# Patient Record
Sex: Male | Born: 1950 | Race: Black or African American | Hispanic: No | Marital: Single | Smoking: Current every day smoker
Health system: Southern US, Community
[De-identification: ages and names within clinical notes are randomized; demographics above are authoritative.]

## PROBLEM LIST (undated history)

## (undated) DIAGNOSIS — I10 Essential (primary) hypertension: Secondary | ICD-10-CM

---

## 2008-04-27 ENCOUNTER — Emergency Department: Payer: Self-pay | Admitting: Emergency Medicine

## 2015-10-27 ENCOUNTER — Encounter: Payer: Self-pay | Admitting: *Deleted

## 2015-10-27 ENCOUNTER — Ambulatory Visit
Admission: EM | Admit: 2015-10-27 | Discharge: 2015-10-27 | Disposition: A | Discharge status: Home / Self Care | Payer: Self-pay | Attending: Internal Medicine | Admitting: Internal Medicine

## 2015-10-27 DIAGNOSIS — Z024 Encounter for examination for driving license: Secondary | ICD-10-CM

## 2015-10-27 DIAGNOSIS — Z0289 Encounter for other administrative examinations: Secondary | ICD-10-CM

## 2015-10-27 HISTORY — DX: Essential (primary) hypertension: I10

## 2015-10-27 LAB — DEPT OF TRANSP DIPSTICK, URINE (ARMC ONLY)
GLUCOSE, UA: NEGATIVE mg/dL
Hgb urine dipstick: NEGATIVE
PROTEIN: NEGATIVE mg/dL
Specific Gravity, Urine: 1.02 (ref 1.005–1.030)

## 2015-10-27 NOTE — ED Provider Notes (Signed)
  [facility]    CSN: [id number] Arrival date & time: 10/27/15  1345  First Provider Contact:  None       History   Chief Complaint Chief Complaint  Patient presents with  . Commercial Driver's License Exam    HPI Shane Powers is a 65 y.o. male. Hx hypertension[facility] in the process of moving to this area and will change PCPs.    HPI  Past Medical History:  Diagnosis Date  . Hypertension     History reviewed. No pertinent surgical history.     Home Medications    Prior to Admission medications   Medication Sig Start Date End Date Taking? Authorizing Provider  lisinopril (PRINIVIL,ZESTRIL) 5 MG tablet Take 5 mg by mouth daily.   Yes Historical Provider, MD    Family History History reviewed. No pertinent family history.  Social History Social History  Substance Use Topics  . Smoking status: Current Every Day Smoker  . Smokeless tobacco: Never Used  . Alcohol use Yes     Allergies   Review of patient's allergies indicates no known allergies.   Review of Systems Review of Systems   Physical Exam Triage Vital Signs ED Triage Vitals [10/27/15 1458]  Enc Vitals Group     BP (!) 157/95     Pulse Rate 74     Resp 16     Temp 97 F (36.1 C)     Temp Source Tympanic     SpO2 97 %     Updated Vital Signs BP (!) 142/87 (BP Location: Left Arm)   Pulse 72   Temp 97 F (36.1 C) (Tympanic)   Resp 16   SpO2 97%  Physical Exam  Constitutional: He is oriented to person, place, and time. No distress.  Alert, nicely groomed  HENT:  Head: Atraumatic.  Eyes:  Conjugate gaze, no eye redness/drainage  Neck: Neck supple.  Cardiovascular: Normal rate and regular rhythm.   Pulmonary/Chest: No respiratory distress.  Lungs clear, symmetric breath sounds  Abdominal: Soft. He exhibits no distension. There is no tenderness. There is no guarding.  Musculoskeletal: Normal range of motion.  Neurological: He is alert and  oriented to person, place, and time.  Skin: Skin is warm and dry.  No cyanosis  Nursing note and vitals reviewed.    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  Wear corrective lenses.       Eustace MooreLaura W Fares Ramthun, MD 10/28/15 2220

## 2015-10-27 NOTE — ED Triage Notes (Signed)
Here for DOT physical. 

## 2016-06-07 ENCOUNTER — Ambulatory Visit
Admission: EM | Admit: 2016-06-07 | Discharge: 2016-06-07 | Disposition: A | Discharge status: Home / Self Care | Payer: Medicare Other | Attending: Emergency Medicine | Admitting: Emergency Medicine

## 2016-06-07 ENCOUNTER — Encounter: Payer: Self-pay | Admitting: Emergency Medicine

## 2016-06-07 ENCOUNTER — Ambulatory Visit: Payer: Medicare Other

## 2016-06-07 DIAGNOSIS — R05 Cough: Secondary | ICD-10-CM | POA: Diagnosis present

## 2016-06-07 DIAGNOSIS — R0981 Nasal congestion: Secondary | ICD-10-CM

## 2016-06-07 DIAGNOSIS — J069 Acute upper respiratory infection, unspecified: Secondary | ICD-10-CM | POA: Diagnosis not present

## 2016-06-07 MED ORDER — DOXYCYCLINE HYCLATE 100 MG PO CAPS: 100.0000 mg | ORAL_CAPSULE | Freq: Two times a day (BID) | ORAL | 0 refills | Status: DC

## 2016-06-07 MED ORDER — BENZONATATE 200 MG PO CAPS: 200.0000 mg | ORAL_CAPSULE | Freq: Three times a day (TID) | ORAL | 0 refills | Status: DC | PRN

## 2016-06-07 NOTE — ED Provider Notes (Signed)
[facility] ____________________________________________  Time seen: Approximately 10:17 AM  I have reviewed the triage vital signs and the nursing notes.   HISTORY  Chief Complaint Cough  HPI Shane Powers is a 66 y.o. male presenting for the complaints of 1 week of runny nose, nasal congestion, cough, chest congestion. Reports intermittent chills and body aches, but denies fevers. States occasional scratchy throat last week, but only with cough, denies current sore throat. Reports cough does intermittently come up at night. States cough is primarily dry hacking cough. Denies wheezing. Reports has been taking multiple over-the-counter cough and congestion agents with minimal improvement. 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  Denies chest pain, shortness of breath, abdominal pain, dysuria, extremity pain, extremity swelling or rash. Denies recent sickness. Denies recent antibiotic use.  Denies cardiac history. Denies renal insufficiency. Denies history of emphysema, COPD, PE/DVT, or asthma. Reports intermittent smoker.   Past Medical History:  Diagnosis Date  . Hypertension     There are no active problems to display for this patient.   History reviewed. No pertinent surgical history.   No current facility-administered medications for this encounter.   Current Outpatient Prescriptions:  .  amLODipine (NORVASC) 10 MG tablet, Take 10 mg by mouth daily., Disp: , Rfl:  .  benzonatate (TESSALON) 200 MG capsule, Take 1 capsule (200 mg total) by mouth 3 (three) times daily as needed for cough., Disp: 20 capsule, Rfl: 0 .  doxycycline (VIBRAMYCIN) 100 MG capsule, Take 1 capsule (100 mg total) by mouth 2 (two) times daily., Disp: 20 capsule, Rfl: 0 .  lisinopril (PRINIVIL,ZESTRIL) 5 MG tablet, Take 5 mg by mouth daily., Disp: , Rfl:   Allergies Patient has no known allergies.  History reviewed. No pertinent family  history.  Social History Social History  Substance Use Topics  . Smoking status: Current Every Day Smoker  . Smokeless tobacco: Never Used  . Alcohol use Yes    Review of Systems Constitutional: As above. Eyes: No visual changes. ENT: As above. Cardiovascular: Denies chest pain. Respiratory: Denies shortness of breath. Gastrointestinal: No abdominal pain.  No nausea, no vomiting.  No diarrhea.  No constipation. Genitourinary: Negative for dysuria. Musculoskeletal: Negative for back pain. Skin: Negative for rash. Neurological: Negative for headaches, focal weakness or numbness.  10-point ROS otherwise negative.  ____________________________________________   PHYSICAL EXAM:  VITAL SIGNS: ED Triage Vitals  Enc Vitals Group     BP 06/07/16 0854 (!) 145/75     Pulse Rate 06/07/16 0854 73     Resp 06/07/16 0854 16     Temp 06/07/16 0854 98.7 F (37.1 C)     Temp Source 06/07/16 0854 Oral     SpO2 06/07/16 0854 99 %     Weight 06/07/16 0852 209 lb (94.8 kg)     Height 06/07/16 0852 6\' 2"  (1.88 m)     Head Circumference --      Peak Flow --      Pain Score 06/07/16 0853 0     Pain Loc --      Pain Edu? --      Excl. in GC? --    Constitutional: Alert and oriented. Well appearing and in no acute distress. Eyes: Conjunctivae are normal. PERRL. EOMI. Head: Atraumatic. No sinus tenderness to palpation. No swelling. No erythema.  Ears: no erythema, normal TMs bilaterally.   Nose:Nasal congestion with clear rhinorrhea  Mouth/Throat: Mucous membranes are moist. No pharyngeal erythema. No  tonsillar swelling or exudate.  Neck: No stridor.  No cervical spine tenderness to palpation. Hematological/Lymphatic/Immunilogical: No cervical lymphadenopathy. Cardiovascular: Normal rate, regular rhythm. Grossly normal heart sounds.  Good peripheral circulation. Respiratory: Normal respiratory effort.  No retractions. Good air movement. No wheezes. Mild rhonchi right base. Dry  intermittent cough noted in room. Speaks in complete sentences. Gastrointestinal: Soft and nontender.No CVA tenderness. Musculoskeletal: Ambulatory with steady gait. No cervical, thoracic or lumbar tenderness to palpation. Bilateral lower extremity nontender and no edema noted. Neurologic:  Normal speech and language. No gait instability. Skin:  Skin appears warm, dry and intact. No rash noted. Psychiatric: Mood and affect are normal. Speech and behavior are normal. ___________________________________________   LABS (all labs ordered are listed, but only abnormal results are displayed)  Labs Reviewed - No data to display  RADIOLOGY  Dg Chest 2 View  Result Date: 06/07/2016 CLINICAL DATA:  Cough and congestion. EXAM: CHEST  2 VIEW COMPARISON:  None. FINDINGS: The heart size and mediastinal contours are within normal limits. Both lungs are clear. The visualized skeletal structures are unremarkable. IMPRESSION: No active cardiopulmonary disease. Electronically Signed   By: Signa Kellaylor  Stroud M.D.   On: 06/07/2016 09:40   ____________________________________________   PROCEDURES Procedures     INITIAL IMPRESSION / ASSESSMENT AND PLAN / ED COURSE  Pertinent labs & imaging results that were available during my care of the patient were reviewed by me and considered in my medical decision making (see chart for details).  Well-appearing patient. No acute distress. Chest x-ray no active cardiopulmonary disease per radiologist. Discussed in detail with patient suspect upper respiratory infection, concern for secondary infection. Will treat patient with oral doxycycline. 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 Encouraged rest, fluids and supportive care. Discussed strict follow-up and return parameters.  Discussed follow up with Primary care physician this week. Discussed follow up and return parameters including no resolution or any worsening concerns.  Patient verbalized understanding and agreed to plan.   ____________________________________________   FINAL CLINICAL IMPRESSION(S) / ED DIAGNOSES  Final diagnoses:  URI with cough and congestion     Discharge Medication List as of 06/07/2016  9:49 AM    START taking these medications   Details  benzonatate (TESSALON) 200 MG capsule Take 1 capsule (200 mg total) by mouth 3 (three) times daily as needed for cough., Starting Mon 06/07/2016, Normal    doxycycline (VIBRAMYCIN) 100 MG capsule Take 1 capsule (100 mg total) by mouth 2 (two) times daily., Starting Mon 06/07/2016, Normal        Note: This dictation was prepared with Dragon dictation along with smaller phrase technology. Any transcriptional errors that result from this process are unintentional.         Renford DillsLindsey Beatriz Quintela, NP 06/07/16 1023

## 2016-06-07 NOTE — ED Triage Notes (Signed)
Patient c/o cough, chest congestion, and sinus pressure since last Monday.

## 2016-06-07 NOTE — Discharge Instructions (Signed)
Take medication as prescribed. Rest. Drink plenty of fluids.  ° °Follow up with your primary care physician this week as needed. Return to Urgent care for new or worsening concerns.  ° °

## 2017-04-27 ENCOUNTER — Encounter: Payer: Self-pay | Admitting: *Deleted

## 2017-04-27 ENCOUNTER — Ambulatory Visit
Admission: EM | Admit: 2017-04-27 | Discharge: 2017-04-27 | Disposition: A | Discharge status: Home / Self Care | Payer: BLUE CROSS/BLUE SHIELD | Attending: Family Medicine | Admitting: Family Medicine

## 2017-04-27 ENCOUNTER — Other Ambulatory Visit: Payer: Self-pay

## 2017-04-27 DIAGNOSIS — R109 Unspecified abdominal pain: Secondary | ICD-10-CM

## 2017-04-27 DIAGNOSIS — R69 Illness, unspecified: Secondary | ICD-10-CM

## 2017-04-27 DIAGNOSIS — R6883 Chills (without fever): Secondary | ICD-10-CM | POA: Diagnosis not present

## 2017-04-27 DIAGNOSIS — J111 Influenza due to unidentified influenza virus with other respiratory manifestations: Secondary | ICD-10-CM

## 2017-04-27 DIAGNOSIS — R0981 Nasal congestion: Secondary | ICD-10-CM | POA: Diagnosis not present

## 2017-04-27 DIAGNOSIS — M545 Low back pain: Secondary | ICD-10-CM | POA: Diagnosis not present

## 2017-04-27 DIAGNOSIS — M791 Myalgia, unspecified site: Secondary | ICD-10-CM | POA: Diagnosis not present

## 2017-04-27 DIAGNOSIS — S39012A Strain of muscle, fascia and tendon of lower back, initial encounter: Secondary | ICD-10-CM | POA: Diagnosis not present

## 2017-04-27 DIAGNOSIS — T148XXA Other injury of unspecified body region, initial encounter: Secondary | ICD-10-CM

## 2017-04-27 MED ORDER — METHOCARBAMOL 750 MG PO TABS: 750.0000 mg | ORAL_TABLET | Freq: Every evening | ORAL | 0 refills | Status: AC | PRN

## 2017-04-27 MED ORDER — OSELTAMIVIR PHOSPHATE 75 MG PO CAPS: 75.0000 mg | ORAL_CAPSULE | Freq: Two times a day (BID) | ORAL | 0 refills | Status: AC

## 2017-04-27 NOTE — Discharge Instructions (Signed)
Take medication as prescribed. Rest. Drink plenty of fluids.  ° °Follow up with your primary care physician this week as needed. Return to Urgent care for new or worsening concerns.  ° °

## 2017-04-27 NOTE — ED Provider Notes (Signed)
[facility] ____________________________________________  Time seen: Approximately 4 PM  I have reviewed the triage vital signs and the nursing notes.   HISTORY  Chief Complaint Flank Pain   HPI Shane Powers is a 67 y.o. male presenting for evaluation of 2 days of runny nose, nasal congestion, chills and occasional body aches.  Denies known fevers.  States a lot of nasal congestion.  Rare cough. No sore throat. States symptoms were quick in onset.  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  Has not been taking any cough or congestion medications for the same complaints.  Has continue to remain active.  Continues to drink plenty of fluids.  Denies associated chest pain, shortness of breath, abdominal pain, syncope or near syncope.  Denies recent antibiotic use or recent sickness.  Patient also complains of right lower back pain that is been present for approximately 1 week.  States that he first noticed the pain when he was bending forward putting on his shoe in his truck, and admits that he does have to twist awkwardly for this.  States that the pain is intermittent.  States that this time sitting still he has no pain to his back.  States pain is often present with movements but sometimes at rest.  Denies any pain radiation, wrapping to abdomen, chest pain.  States pain sometimes feels tight and then sometimes he feels like it squeezes and spasms.  States that he feels like he worked out too hard.  Denies fall or direct trauma.  Denies any dysuria, hematuria, history of kidney stones, abdominal pain, constipation, diarrhea, vomiting, rash or skin changes.  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  Past Medical History:  Diagnosis Date  . Hypertension   Cervical and lumbar disc issues.  There are no active problems to display for this  patient.   History reviewed. No pertinent surgical history.   No current facility-administered medications for this encounter.   Current Outpatient Medications:  .  amLODipine (NORVASC) 10 MG tablet, Take 10 mg by mouth daily., Disp: , Rfl:  .  lisinopril (PRINIVIL,ZESTRIL) 5 MG tablet, Take 5 mg by mouth daily., Disp: , Rfl:  .  methocarbamol (ROBAXIN-750) 750 MG tablet, Take 1 tablet (750 mg total) by mouth at bedtime as needed for muscle spasms (pain). Do not drive can cause drowsiness., Disp: 10 tablet, Rfl: 0 .  oseltamivir (TAMIFLU) 75 MG capsule, Take 1 capsule (75 mg total) by mouth every 12 (twelve) hours., Disp: 10 capsule, Rfl: 0  Allergies Patient has no known allergies.  Family History  Problem Relation Age of Onset  . Cancer Mother   . Stroke Father     Social History Social History   Tobacco Use  . Smoking status: Current Every Day Smoker    Types: Cigarettes  . Smokeless tobacco: Never Used  Substance Use Topics  . Alcohol use: Yes  . Drug use: No    Review of Systems Constitutional: No fever/chills ENT: No sore throat. Cardiovascular: Denies chest pain. Respiratory: Denies shortness of breath. Gastrointestinal: No abdominal pain.  No nausea, no vomiting.  No diarrhea.  No constipation. Genitourinary: Negative for dysuria. Musculoskeletal: as above Skin: Negative for rash. Neurological: Negative for headaches, focal weakness or numbness.   ____________________________________________   PHYSICAL EXAM:  VITAL SIGNS: ED  Triage Vitals  Enc Vitals Group     BP 04/27/17 1535 (!) 139/94     Pulse Rate 04/27/17 1535 73     Resp 04/27/17 1535 16     Temp 04/27/17 1535 98.3 F (36.8 C)     Temp Source 04/27/17 1535 Oral     SpO2 04/27/17 1535 99 %     Weight 04/27/17 1538 220 lb (99.8 kg)     Height 04/27/17 1538 6\' 2"  (1.88 m)     Head Circumference --      Peak Flow --      Pain Score 04/27/17 1537 5     Pain Loc --      Pain Edu? --       Excl. in GC? --    Constitutional: Alert and oriented. Well appearing and in no acute distress. Eyes: Conjunctivae are normal. PERRL. Head: Atraumatic. No sinus tenderness to palpation. No swelling. No erythema.  Ears: no erythema, normal TMs bilaterally.   Nose:Nasal congestion with clear rhinorrhea  Mouth/Throat: Mucous membranes are moist. No pharyngeal erythema. No tonsillar swelling or exudate.  Neck: No stridor.  No cervical spine tenderness to palpation. Hematological/Lymphatic/Immunilogical: No cervical lymphadenopathy. Cardiovascular: Normal rate, regular rhythm. Grossly normal heart sounds.  Good peripheral circulation. Respiratory: Normal respiratory effort.  No retractions. No wheezes, rales or rhonchi. Good air movement.  Gastrointestinal: Soft and nontender. Normal Bowel sounds. No CVA tenderness. Musculoskeletal: Ambulatory with steady gait. No cervical, thoracic or lumbar tenderness to palpation.  Except: Right lower parathoracic and right upper paralumbar right-sided tenderness to palpation, no true CVA tenderness, pain along latissimus dorsi, pain reproduced by direct palpation and right and left lumbar twisting, no pain with flexion or extension, no pain with standing knee lifts, no saddle anesthesia, changes positions quickly in room.  No rash. Neurologic:  Normal speech and language. No gait instability. Skin:  Skin appears warm, dry and intact. No rash noted. Psychiatric: Mood and affect are normal. Speech and behavior are normal.  ___________________________________________   LABS (all labs ordered are listed, but only abnormal results are displayed)  Labs Reviewed - No data to display ____________________________________________  PROCEDURES Procedures    INITIAL IMPRESSION / ASSESSMENT AND PLAN / ED COURSE  Pertinent labs & imaging results that were available during my care of the patient were reviewed by me and considered in my medical decision making (see  chart for details).  Well-appearing patient.  No acute distress.  Suspect viral illness such as influenza, discussed use of Tamiflu, patient request, Rx given.  Patient also with right lower back pain, appears to be musculoskeletal and consistent with muscular strain.  No midline tenderness and denies trauma.  No urinary symptoms.  Discussed supportive care.  Over-the-counter ibuprofen as needed and as needed Robaxin.  Discussed no use of muscle relaxant when driving.  Discussed stretching and supportive care.  Discussed strict follow-up and return parameters.Discussed indication, risks and benefits of medications with patient.  Discussed follow up with Primary care physician this week. Discussed follow up and return parameters including no resolution or any worsening concerns. Patient verbalized understanding and agreed to plan.   ____________________________________________   FINAL CLINICAL IMPRESSION(S) / ED DIAGNOSES  Final diagnoses:  Right flank pain  Muscle strain  Influenza-like illness     ED Discharge Orders        Ordered    methocarbamol (ROBAXIN-750) 750 MG tablet  At bedtime PRN     04/27/17 1648    oseltamivir (TAMIFLU)  75 MG capsule  Every 12 hours     04/27/17 1648       Note: This dictation was prepared with Dragon dictation along with smaller phrase technology. Any transcriptional errors that result from this process are unintentional.         Renford Dills, NP 04/27/17 2126

## 2017-04-27 NOTE — ED Triage Notes (Signed)
Right flank pain for 1 week. Nasal congestion and drainage 2 days ago.

## 2018-04-23 IMAGING — CR DG CHEST 2V
3 series · 3 of 3 positions shown · non-contrast
Comparison: None.

CLINICAL DATA: Cough and congestion.

EXAM:
CHEST  2 VIEW

[chest pa]
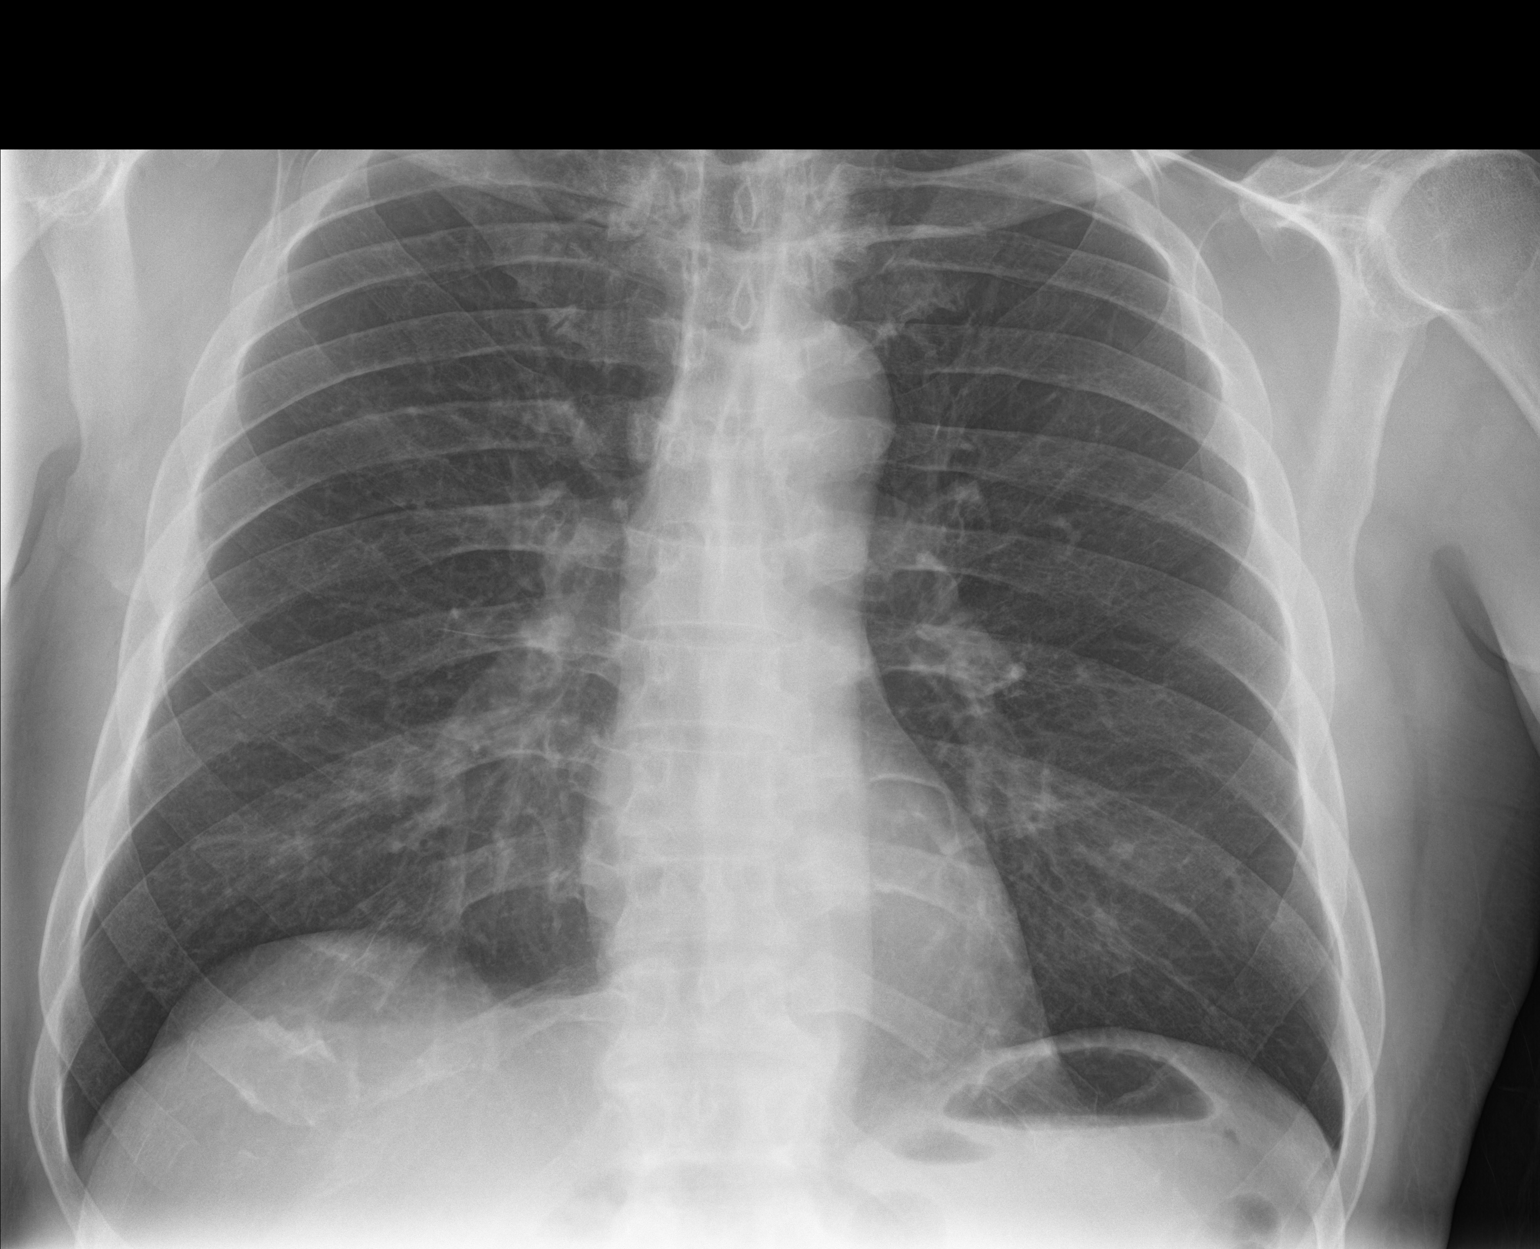

[chest lat (1 of 2)]
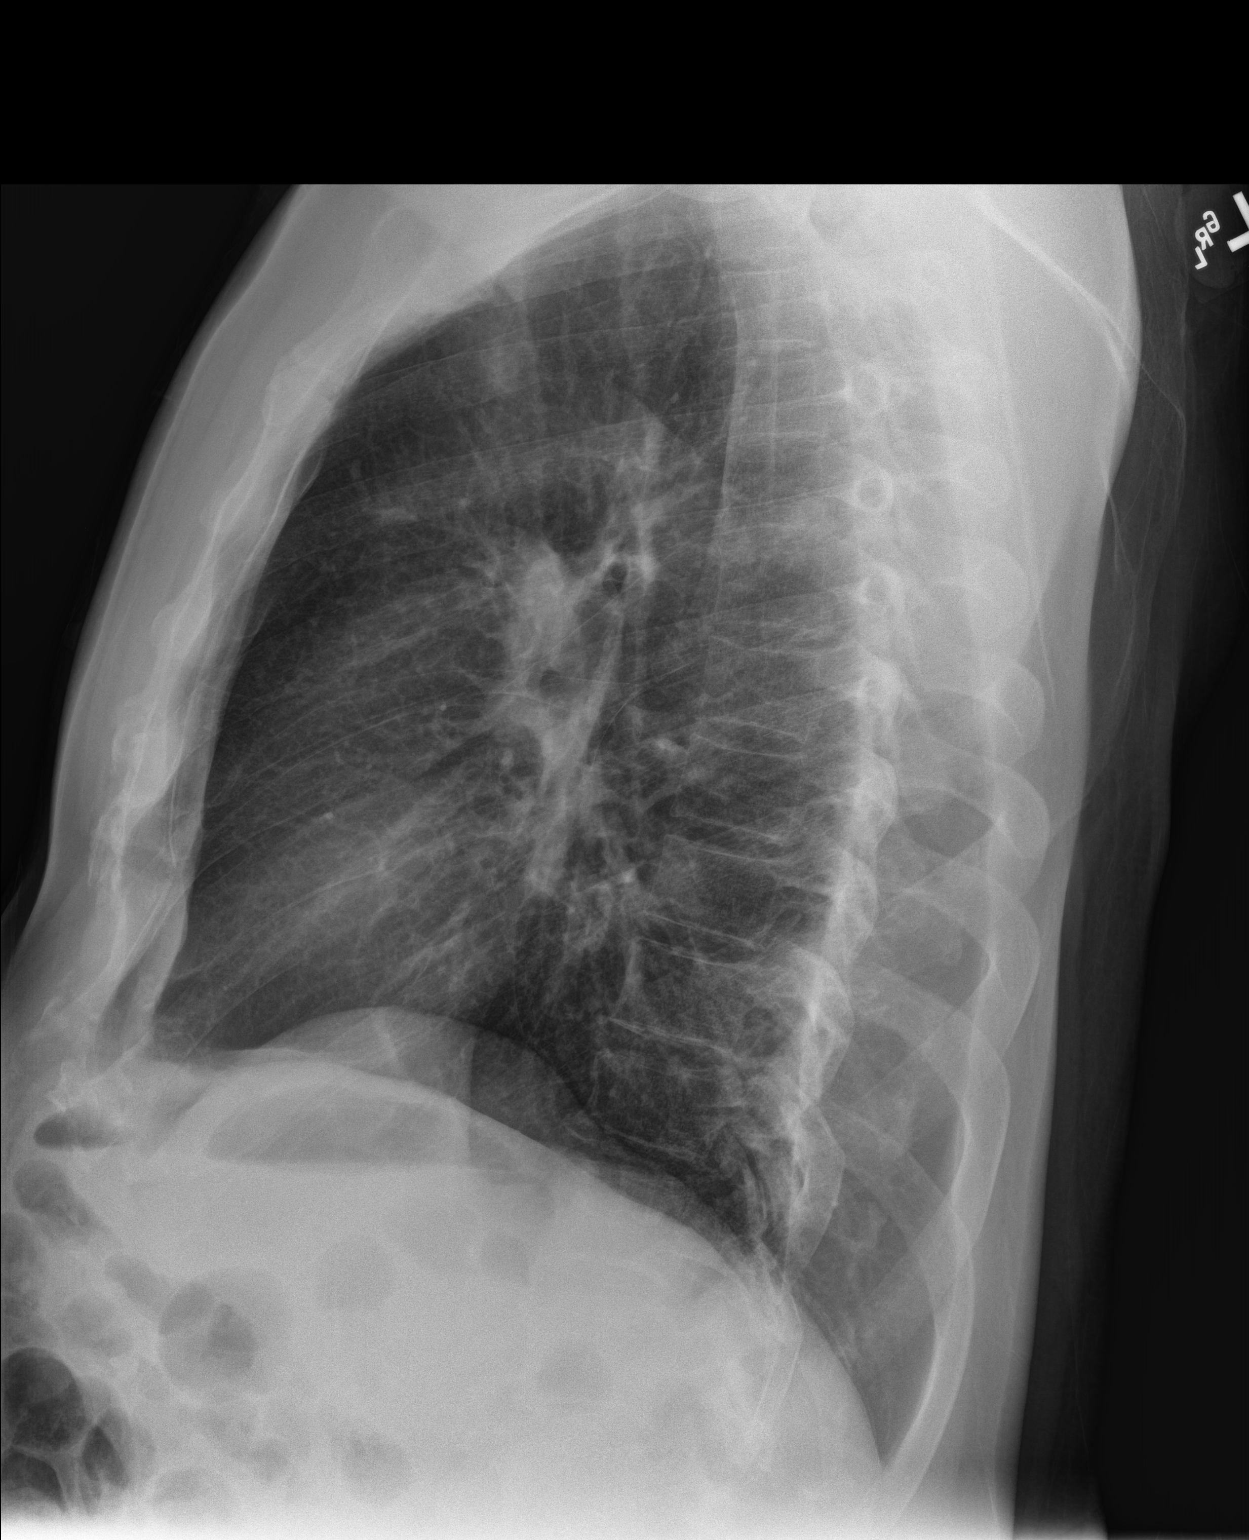

[chest lat (2 of 2)]
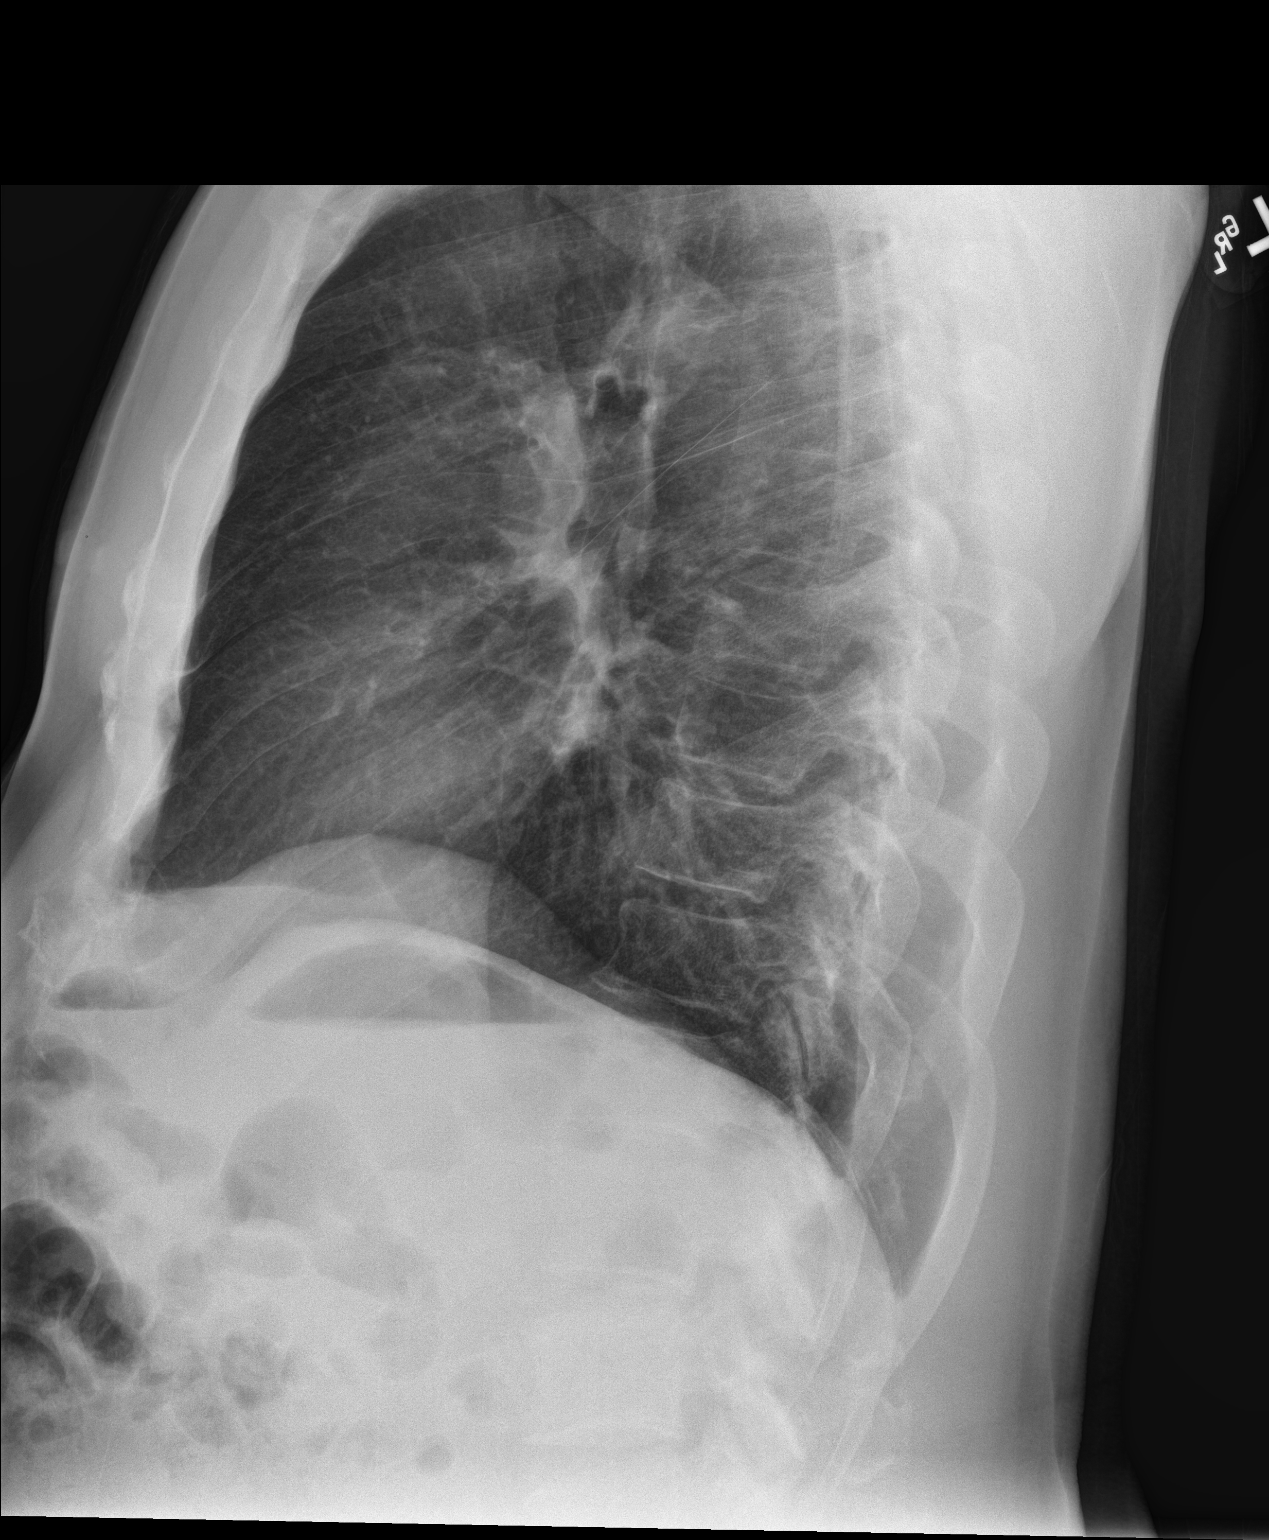

[3 of 3 positions shown; findings below may reference images not displayed]

FINDINGS: The heart size and mediastinal contours are within normal limits.
Both lungs are clear. The visualized skeletal structures are
unremarkable.
IMPRESSION: No active cardiopulmonary disease.
